# Patient Record
Sex: Female | Born: 2005 | Race: White | Hispanic: No | Marital: Single | Smoking: Never smoker
Health system: Southern US, Community
[De-identification: ages and names within clinical notes are randomized; demographics above are authoritative.]

## PROBLEM LIST (undated history)

## (undated) DIAGNOSIS — R159 Full incontinence of feces: Secondary | ICD-10-CM

## (undated) DIAGNOSIS — K5909 Other constipation: Secondary | ICD-10-CM

## (undated) HISTORY — DX: Other constipation: K59.09

## (undated) HISTORY — DX: Full incontinence of feces: R15.9

---
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
## 2023-01-23 ENCOUNTER — Telehealth: Payer: Self-pay

## 2023-01-23 NOTE — Telephone Encounter (Signed)
Copied from CRM [id number]. Topic: Appointments - Appointment Scheduling >> Jan 22, 2023 12:48 PM Tiffany H wrote: Patient's mother called to request Rachel Strickland submit an order for labwork. Patient will be coming in for a well-woman consult 01/29/23 at [facility]. Please follow up with mother once order has been submitted.

## 2023-01-24 ENCOUNTER — Telehealth: Payer: Self-pay

## 2023-01-24 DIAGNOSIS — Z131 Encounter for screening for diabetes mellitus: Secondary | ICD-10-CM

## 2023-01-24 DIAGNOSIS — Z0000 Encounter for general adult medical examination without abnormal findings: Secondary | ICD-10-CM

## 2023-01-24 DIAGNOSIS — Z13220 Encounter for screening for lipoid disorders: Secondary | ICD-10-CM

## 2023-01-24 DIAGNOSIS — Z1329 Encounter for screening for other suspected endocrine disorder: Secondary | ICD-10-CM

## 2023-01-24 NOTE — Telephone Encounter (Signed)
 Copied from CRM [id number]. Topic: Appointments - Appointment Scheduling >> Jan 22, 2023 12:48 PM Tiffany H wrote: Patient's mother called to request Rachel Strickland submit an order for labwork. Patient will be coming in for a well-woman consult 01/29/23 at [facility]. Please follow up with mother once order has been submitted.

## 2023-01-24 NOTE — Telephone Encounter (Signed)
Patient informed. 

## 2023-01-24 NOTE — Telephone Encounter (Signed)
Patient mother informed.

## 2023-01-29 ENCOUNTER — Ambulatory Visit: Payer: Self-pay | Admitting: Physician Assistant

## 2023-02-06 DIAGNOSIS — Z1329 Encounter for screening for other suspected endocrine disorder: Secondary | ICD-10-CM | POA: Diagnosis not present

## 2023-02-06 DIAGNOSIS — Z0000 Encounter for general adult medical examination without abnormal findings: Secondary | ICD-10-CM | POA: Diagnosis not present

## 2023-02-06 DIAGNOSIS — Z13220 Encounter for screening for lipoid disorders: Secondary | ICD-10-CM | POA: Diagnosis not present

## 2023-02-06 DIAGNOSIS — Z131 Encounter for screening for diabetes mellitus: Secondary | ICD-10-CM | POA: Diagnosis not present

## 2023-02-07 ENCOUNTER — Ambulatory Visit: Payer: Self-pay | Admitting: Physician Assistant

## 2023-02-07 LAB — CBC WITH DIFFERENTIAL/PLATELET
Basophils Absolute: 0.1 10*3/uL (ref 0.0–0.2)
Basos: 1 %
EOS (ABSOLUTE): 0.6 10*3/uL — ABNORMAL HIGH (ref 0.0–0.4)
Eos: 4 %
Hematocrit: 39.9 % (ref 34.0–46.6)
Hemoglobin: 13 g/dL (ref 11.1–15.9)
Immature Grans (Abs): 0 10*3/uL (ref 0.0–0.1)
Immature Granulocytes: 0 %
Lymphocytes Absolute: 4.3 10*3/uL — ABNORMAL HIGH (ref 0.7–3.1)
Lymphs: 30 %
MCH: 26.3 pg — ABNORMAL LOW (ref 26.6–33.0)
MCHC: 32.6 g/dL (ref 31.5–35.7)
MCV: 81 fL (ref 79–97)
Monocytes Absolute: 0.8 10*3/uL (ref 0.1–0.9)
Monocytes: 6 %
Neutrophils Absolute: 8.5 10*3/uL — ABNORMAL HIGH (ref 1.4–7.0)
Neutrophils: 59 %
Platelets: 472 10*3/uL — ABNORMAL HIGH (ref 150–450)
RBC: 4.95 x10E6/uL (ref 3.77–5.28)
RDW: 12.6 % (ref 11.7–15.4)
WBC: 14.3 10*3/uL — ABNORMAL HIGH (ref 3.4–10.8)

## 2023-02-07 LAB — CMP14+EGFR
ALT: 25 [IU]/L (ref 0–32)
AST: 27 [IU]/L (ref 0–40)
Albumin: 4.6 g/dL (ref 4.0–5.0)
Alkaline Phosphatase: 88 [IU]/L (ref 42–106)
BUN/Creatinine Ratio: 7 — ABNORMAL LOW (ref 9–23)
BUN: 6 mg/dL (ref 6–20)
Bilirubin Total: 0.5 mg/dL (ref 0.0–1.2)
CO2: 23 mmol/L (ref 20–29)
Calcium: 9.7 mg/dL (ref 8.7–10.2)
Chloride: 101 mmol/L (ref 96–106)
Creatinine, Ser: 0.88 mg/dL (ref 0.57–1.00)
Globulin, Total: 3.4 g/dL (ref 1.5–4.5)
Glucose: 80 mg/dL (ref 70–99)
Potassium: 4.1 mmol/L (ref 3.5–5.2)
Sodium: 138 mmol/L (ref 134–144)
Total Protein: 8 g/dL (ref 6.0–8.5)
eGFR: 97 mL/min/{1.73_m2} (ref >59)

## 2023-02-07 LAB — LIPID PANEL
Chol/HDL Ratio: 4.4 {ratio} (ref 0.0–4.4)
Cholesterol, Total: 193 mg/dL — ABNORMAL HIGH (ref 100–169)
HDL: 44 mg/dL (ref >39)
LDL Chol Calc (NIH): 118 mg/dL — ABNORMAL HIGH (ref 0–109)
Triglycerides: 177 mg/dL — ABNORMAL HIGH (ref 0–89)
VLDL Cholesterol Cal: 31 mg/dL (ref 5–40)

## 2023-02-07 LAB — TSH: TSH: 1.72 u[IU]/mL (ref 0.450–4.500)

## 2023-02-07 NOTE — Progress Notes (Signed)
WBC elevated. Looks like you could be getting sick or already sick?  Thyroid looks great.  Kidney, liver, glucose look good.  TG and LDL elevated. We can discuss this at upcoming appt.

## 2023-02-14 ENCOUNTER — Other Ambulatory Visit (HOSPITAL_COMMUNITY): Payer: Self-pay

## 2023-02-14 ENCOUNTER — Ambulatory Visit: Payer: Commercial Managed Care - PPO | Admitting: Physician Assistant

## 2023-02-14 ENCOUNTER — Encounter: Payer: Self-pay | Admitting: Physician Assistant

## 2023-02-14 VITALS — BP 121/71 | HR 86 | Ht 66.0 in | Wt 205.0 lb

## 2023-02-14 DIAGNOSIS — E6609 Other obesity due to excess calories: Secondary | ICD-10-CM | POA: Insufficient documentation

## 2023-02-14 DIAGNOSIS — E66811 Obesity, class 1: Secondary | ICD-10-CM | POA: Diagnosis not present

## 2023-02-14 DIAGNOSIS — Z23 Encounter for immunization: Secondary | ICD-10-CM | POA: Diagnosis not present

## 2023-02-14 DIAGNOSIS — N926 Irregular menstruation, unspecified: Secondary | ICD-10-CM | POA: Diagnosis not present

## 2023-02-14 DIAGNOSIS — E781 Pure hyperglyceridemia: Secondary | ICD-10-CM | POA: Diagnosis not present

## 2023-02-14 DIAGNOSIS — M79671 Pain in right foot: Secondary | ICD-10-CM

## 2023-02-14 DIAGNOSIS — Z6833 Body mass index (BMI) 33.0-33.9, adult: Secondary | ICD-10-CM

## 2023-02-14 DIAGNOSIS — E7800 Pure hypercholesterolemia, unspecified: Secondary | ICD-10-CM | POA: Diagnosis not present

## 2023-02-14 DIAGNOSIS — E663 Overweight: Secondary | ICD-10-CM | POA: Diagnosis not present

## 2023-02-14 MED ORDER — NORETHIN ACE-ETH ESTRAD-FE 1-20 MG-MCG PO TABS
1.0000 | ORAL_TABLET | Freq: Every day | ORAL | 3 refills | Status: DC
  Filled 2023-02-14: qty 84, 84d supply, fill #0
  Filled 2023-05-04: qty 84, 84d supply, fill #1
  Filled 2023-07-28: qty 84, 84d supply, fill #2
  Filled 2023-10-29: qty 84, 84d supply, fill #3

## 2023-02-14 NOTE — Progress Notes (Signed)
 Established Patient Office Visit  Subjective   Patient ID: Rachel Strickland, female    DOB: [birth year]  Age: 18 y.o. MRN: [id number]  Chief Complaint  Patient presents with   Medical Management of Chronic Issues    Menstrual cramps and irregular period    HPI Pt is a 18 yo female who presents to the clinic with mother to discuss irregular periods and weight gain. She has not been using OCP. She will go up to 100 days without a period and then get a really heavy painful period. She is in college. She does not exercise but she is active. She is worried about her lack of normal cycle.   She had labs drawn a few days ago and would like to go over.   She is having some intermittent right dorsal foot pain after jumping down off a platform 1-2 weeks ago. Pain is only intermittent and when she is weight bearing. Done nothing to make better. No swelling noted.    .. Active Ambulatory Problems    Diagnosis Date Noted   Chronic constipation    Encopresis    Closed fracture of proximal end of right humerus 05/05/2015   Situational depression 01/23/2016   Situational anxiety 01/23/2016   Stress at home 01/23/2016   Tachycardia 10/15/2017   Anxiety 10/19/2019   Celiac disease 02/08/2014   Dysmenorrhea 06/06/2021   Sprain of right ankle 06/11/2021   Elevated LDL cholesterol level 02/14/2023   Hypertriglyceridemia 02/14/2023   Class 1 obesity due to excess calories without serious comorbidity with body mass index (BMI) of 33.0 to 33.9 in adult 02/14/2023   Resolved Ambulatory Problems    Diagnosis Date Noted   No Resolved Ambulatory Problems   Past Medical History:  Diagnosis Date   Encopresis(307.7)      ROS See HPI.    Objective:     BP 121/71   Pulse 86   Ht 5' 6 (1.676 m)   Wt 205 lb (93 kg)   SpO2 99%   BMI 33.09 kg/m  BP Readings from Last 3 Encounters:  02/14/23 121/71  09/17/21 127/75 (94%, Z = 1.55 /  85%, Z = 1.04)*  06/11/21 113/71 (60%, Z = 0.25 /  72%, Z  = 0.58)*   *BP percentiles are based on the 2017 AAP Clinical Practice Guideline for girls   Wt Readings from Last 3 Encounters:  02/14/23 205 lb (93 kg) (98%, Z= 2.02)*  09/17/21 195 lb (88.5 kg) (97%, Z= 1.92)*  06/11/21 190 lb (86.2 kg) (97%, Z= 1.86)*   * Growth percentiles are based on CDC (Girls, 2-20 Years) data.      Physical Exam Constitutional:      Appearance: Normal appearance. She is obese.  HENT:     Head: Normocephalic.  Cardiovascular:     Rate and Rhythm: Normal rate.  Pulmonary:     Effort: Pulmonary effort is normal.  Musculoskeletal:     Comments: Right foot:  No swelling or bruising No pinpoint pain over bony structures of foot NROM Strength 5/5   Neurological:     General: No focal deficit present.     Mental Status: She is alert and oriented to person, place, and time.  Psychiatric:        Mood and Affect: Mood normal.         Assessment & Plan:  Rachel Strickland was seen today for medical management of chronic issues.  Diagnoses and all orders for this visit:  Irregular  periods/menstrual cycles -     norethindrone -ethinyl estradiol -FE (JUNEL  FE 1/20) 1-20 MG-MCG tablet; Take 1 tablet by mouth daily. -     PCOS Diagnostic Profile -     Insulin  and C-Peptide  Elevated LDL cholesterol level -     PCOS Diagnostic Profile -     Insulin  and C-Peptide  Hypertriglyceridemia -     PCOS Diagnostic Profile -     Insulin  and C-Peptide  Class 1 obesity due to excess calories without serious comorbidity with body mass index (BMI) of 33.0 to 33.9 in adult -     PCOS Diagnostic Profile -     Insulin  and C-Peptide  Immunization due -     Flu vaccine trivalent PF, 6mos and older(Flulaval,Afluria,Fluarix,Fluzone)   Concern for PCOS  Will order lab testing Encouraged patient to start back on OCP Discussed elevated TG and LDL Encouraged patient to work on diet and consider 16:8 intermittent fasting Goal 150 minutes of exercise a week HO given  Follow  up in 6 months  Right pain in dorsal foot No red flags Where supportive shoe for next 2 weeks Follow up as needed    Return in about 6 months (around 08/14/2023).    Anelly Samarin, PA-C

## 2023-02-14 NOTE — Patient Instructions (Signed)
Diet for Polycystic Ovary Syndrome Polycystic ovary syndrome (PCOS) is a common hormonal disorder that affects a woman's reproductive system. It can cause problems with menstrual periods and make it hard to get and stay pregnant. Changing what you eat can help your hormones reach normal levels, improve your health, and help you better manage PCOS. Following a balanced diet can help you lose weight and improve the way that your body uses the hormone insulin to control blood sugar. This may include: Eating low-fat (lean) proteins, complex carbohydrates, fresh fruits and vegetables, low-fat dairy products, healthy fats, and fiber. Cutting down on calories. Exercising regularly. What are tips for following this plan? Follow a balanced diet for meals and snacks. Eat breakfast, lunch, dinner, and one or two snacks every day. Include protein in each meal and snack. Choose whole grains instead of products that are made with refined flour. Eat a variety of foods. Exercise regularly as told by your health care provider. Aim to do at least 30 minutes of exercise on most days of the week. If you are overweight or obese: Pay attention to how many calories you eat. Cutting down on calories can help you lose weight. Work with your health care provider or a dietitian to figure out how many calories you need each day. What foods should I eat?  Fruits Include a variety of colors and types. All fruits are helpful for PCOS. Vegetables Include a variety of colors and types. All vegetables are helpful for PCOS. Grains Whole grains, such as whole wheat. Whole-grain breads, crackers, cereals, and pasta. Unsweetened oatmeal. Bulgur, barley, quinoa, and brown rice. Tortillas made from corn or whole-wheat flour. Meats and other proteins Lean proteins, such as fish, chicken, beans, eggs, and tofu. Dairy Low-fat dairy products, such as skim milk, cheese sticks, and yogurt. Beverages Low-fat or fat-free drinks, such  as water, low-fat milk, sugar-free drinks, and small amounts of 100% fruit juice. Seasonings and condiments Ketchup. Mustard. Barbecue sauce. Relish. Low-fat or fat-free mayonnaise. Fats and oils Olive oil or canola oil. Walnuts and almonds. The items listed above may not be a complete list of recommended foods and beverages. Contact a dietitian for more options. What foods should I avoid? Foods that are high in calories or fat, especially saturated or trans fats. Fried foods. Sweets. Products that are made from refined white flour, including white bread, pastries, white rice, and pasta. The items listed above may not be a complete list of foods and beverages to avoid. Contact a dietitian for more information. Summary PCOS is a hormonal imbalance that affects a woman's reproductive system. It can cause problems with menstrual periods and make it hard to get and stay pregnant. You can help to manage your PCOS by exercising regularly and eating a healthy, varied diet of vegetables, fruit, whole grains, lean protein, and low-fat dairy products. Changing what you eat can improve the way that your body uses insulin, help your hormones reach normal levels, and help you lose weight. This information is not intended to replace advice given to you by your health care provider. Make sure you discuss any questions you have with your health care provider. Document Revised: 07/08/2019 Document Reviewed: 07/08/2019 Elsevier Patient Education  2024 Elsevier Inc. Polycystic Ovary Syndrome  Polycystic ovarian syndrome (PCOS) is a common hormonal disorder among women of reproductive age. In most women with PCOS, small fluid-filled sacs (cysts) grow on the ovaries. PCOS can cause problems with menstrual periods and make it hard to get and stay  pregnant. If this condition is not treated, it can lead to serious health problems, such as diabetes and heart disease. What are the causes? The cause of this condition is  not known. It may be due to certain factors, such as: Irregular menstrual cycle. High levels of certain hormones. Problems with the hormone that helps to control blood sugar (insulin). Certain genes. What increases the risk? You are more likely to develop this condition if you: Have a family history of PCOS or type 2 diabetes. Are overweight, eat unhealthy foods, and are not active. These factors may cause problems with blood sugar control, which can contribute to PCOS or PCOS symptoms. What are the signs or symptoms? Symptoms of this condition include: Ovarian cysts and sometimes pelvic pain. Menstrual periods that are not regular or are too heavy. Inability to get or stay pregnant. Increased growth of hair on the face, chest, stomach, back, thumbs, thighs, or toes. Acne or oily skin. Acne may develop during adulthood, and it may not get better with treatment. Weight gain or obesity. Patches of thickened and dark brown or black skin on the neck, arms, breasts, or thighs. How is this diagnosed? This condition is diagnosed based on: Your medical history. A physical exam that includes a pelvic exam. Your health care provider may look for areas of increased hair growth on your skin. Tests, such as: An ultrasound to check the ovaries for cysts and to view the lining of the uterus. Blood tests to check levels of sugar (glucose), female hormone (testosterone), and female hormones (estrogen and progesterone). How is this treated? There is no cure for this condition, but treatment can help to manage symptoms and prevent more health problems from developing. Treatment varies depending on your symptoms and if you want to have a baby or if you need birth control. Treatment may include: Making nutrition and lifestyle changes. Taking the progesterone hormone to start a menstrual period. Taking birth control pills to help you have regular menstrual periods. Taking medicines such as: Medicines to make  you ovulate, if you want to get pregnant. Medicine to reduce extra hair growth. Having surgery in severe cases. This may involve making small holes in one or both of your ovaries. This decreases the amount of testosterone that your body makes. Follow these instructions at home: Take over-the-counter and prescription medicines only as told by your health care provider. Follow a healthy meal plan that includes lean proteins, complex carbohydrates, fresh fruits and vegetables, low-fat dairy products, healthy fats, and fiber. If you are overweight, lose weight as told by your health care provider. Your health care provider can determine how much weight loss is best for you and can help you lose weight safely. Keep all follow-up visits. This is important. Contact a health care provider if: Your symptoms do not get better with medicine. Your symptoms get worse or you develop new symptoms. Summary Polycystic ovarian syndrome (PCOS) is a common hormonal disorder among women of reproductive age. PCOS can cause problems with menstrual periods and make it hard to get and stay pregnant. If this condition is not treated, it can lead to serious health problems, such as diabetes and heart disease. There is no cure for this condition, but treatment can help to manage symptoms and prevent more health problems from developing. This information is not intended to replace advice given to you by your health care provider. Make sure you discuss any questions you have with your health care provider. Document Revised: 07/08/2019 Document  Reviewed: 07/08/2019 Elsevier Patient Education  2024 [employer].

## 2023-02-17 ENCOUNTER — Other Ambulatory Visit (HOSPITAL_COMMUNITY): Payer: Self-pay

## 2023-02-17 DIAGNOSIS — Z1339 Encounter for screening examination for other mental health and behavioral disorders: Secondary | ICD-10-CM | POA: Diagnosis not present

## 2023-02-17 DIAGNOSIS — N915 Oligomenorrhea, unspecified: Secondary | ICD-10-CM | POA: Diagnosis not present

## 2023-02-17 MED ORDER — NORETHINDRONE ACETATE 5 MG PO TABS
5.0000 mg | ORAL_TABLET | Freq: Every day | ORAL | 0 refills | Status: AC
  Filled 2023-02-17: qty 7, 7d supply, fill #0

## 2023-02-17 NOTE — Progress Notes (Signed)
 Rachel Strickland,   You are definitely insulin resistant with insulin level and c peptide both high in blood. Other PCOS lab panel in progress.

## 2023-02-18 ENCOUNTER — Encounter: Payer: Self-pay | Admitting: Physician Assistant

## 2023-02-18 DIAGNOSIS — M79671 Pain in right foot: Secondary | ICD-10-CM | POA: Insufficient documentation

## 2023-02-19 ENCOUNTER — Encounter: Payer: Self-pay | Admitting: Physician Assistant

## 2023-02-24 NOTE — Progress Notes (Signed)
 Panel is coming in very slowly.  Insulin level elevated indicating insulin resistance Thyroid looks good.

## 2023-03-03 ENCOUNTER — Encounter: Payer: Self-pay | Admitting: Physician Assistant

## 2023-03-03 LAB — PCOS DIAGNOSTIC PROFILE
17-Alpha-Hydroxyprogesterone: 34 ng/dL
ANTI-MULLERIAN HORMONE (AMH): 5.5 ng/mL
DHEA-Sulfate, LCMS: 73 ug/dL
Estradiol, Serum, MS: 35 pg/mL
Follicle Stimulating Hormone: 7.6 m[IU]/mL
Free Testosterone, Serum: 5.6 pg/mL
Luteinizing Hormone (LH) ECL: 17 m[IU]/mL
Prolactin: 22.2 ng/mL
Sex Hormone Binding Globulin: 29.1 nmol/L
TSH: 2 uU/mL
Testosterone, Serum (Total): 37 ng/dL
Testosterone-% Free: 1.5 %

## 2023-03-03 LAB — INSULIN AND C-PEPTIDE, SERUM
C-Peptide: 7.7 ng/mL — ABNORMAL HIGH (ref 1.1–4.4)
INSULIN: 81.5 u[IU]/mL — ABNORMAL HIGH (ref 2.6–24.9)

## 2023-03-03 NOTE — Progress Notes (Signed)
New results came in.   Thyroid normal.  Testosterone normal (which is good). All hormones look good.   Other than diet changes. Our biggest goal will be to treat insulin resistance. I do suggest starting metformin. Thoughts?

## 2023-03-04 ENCOUNTER — Encounter: Payer: Self-pay | Admitting: Physician Assistant

## 2023-05-05 ENCOUNTER — Other Ambulatory Visit (HOSPITAL_COMMUNITY): Payer: Self-pay

## 2023-05-05 ENCOUNTER — Other Ambulatory Visit: Payer: Self-pay

## 2023-05-06 ENCOUNTER — Other Ambulatory Visit: Payer: Self-pay

## 2023-07-28 ENCOUNTER — Other Ambulatory Visit (HOSPITAL_COMMUNITY): Payer: Self-pay

## 2023-09-11 ENCOUNTER — Ambulatory Visit: Payer: Self-pay | Admitting: Dermatology

## 2023-09-11 ENCOUNTER — Other Ambulatory Visit: Payer: Self-pay

## 2023-09-11 ENCOUNTER — Other Ambulatory Visit (HOSPITAL_BASED_OUTPATIENT_CLINIC_OR_DEPARTMENT_OTHER): Payer: Self-pay

## 2023-09-11 ENCOUNTER — Other Ambulatory Visit (HOSPITAL_COMMUNITY): Payer: Self-pay

## 2023-09-11 DIAGNOSIS — L2089 Other atopic dermatitis: Secondary | ICD-10-CM

## 2023-09-11 DIAGNOSIS — D2262 Melanocytic nevi of left upper limb, including shoulder: Secondary | ICD-10-CM

## 2023-09-11 DIAGNOSIS — L209 Atopic dermatitis, unspecified: Secondary | ICD-10-CM | POA: Diagnosis not present

## 2023-09-11 DIAGNOSIS — D229 Melanocytic nevi, unspecified: Secondary | ICD-10-CM

## 2023-09-11 MED ORDER — TRIAMCINOLONE ACETONIDE 0.1 % EX CREA
1.0000 | TOPICAL_CREAM | Freq: Two times a day (BID) | CUTANEOUS | 3 refills | Status: AC | PRN
  Filled 2023-09-11: qty 240, 30d supply, fill #0

## 2023-09-11 NOTE — Progress Notes (Signed)
   New Patient Visit   Subjective  Rachel Strickland is a 18 y.o. female who presents for the following: Spot  Rachel Strickland is here for a spot check. She has one spot of concern on L Shoulder and a few moles on her right arm.      She also has dry spots on L arm and R breast she would like to get looked at. When dry spots are flared, she will use Cereve Petroleum jelly which helps for the hydration but not itching. On a scale from 1-10 is about an 8. Rachel Strickland uses Optometrist wash and bath and body works lotion   The patient has spots, moles and lesions to be evaluated, some may be new or changing and the patient may have concern these could be cancer.    The following portions of the chart were reviewed this encounter and updated as appropriate: medications, allergies, medical history  Review of Systems:  No other skin or systemic complaints except as noted in HPI or Assessment and Plan.  Objective  Well appearing patient in no apparent distress; mood and affect are within normal limits.  A focused examination was performed of the following areas: Bilateral arms, L Shoulder Relevant physical exam findings are noted in the Assessment and Plan.    Assessment & Plan   ATOPIC DERMATITIS Exam: Scaly pink papules coalescing to plaques  Flared  Atopic dermatitis (eczema) is a chronic, relapsing, pruritic condition that can significantly affect quality of life. It is often associated with allergic rhinitis and/or asthma and can require treatment with topical medications, phototherapy, or in severe cases biologic injectable medication (Dupixent; Adbry) or Oral JAK inhibitors.  Treatment Plan: -Triamcinolone  to use twice a day for up to two weeks. Take a break for two weeks. Can restart if not clear -Continue Cereve ointment or vasaline  -Recommends gentle body care  Recommend gentle skin care.  MELANOCYTIC NEVI Exam: Tan-brown and/or pink-flesh-colored symmetric macules and papules  L  Posterior Shoulder 5mm Brown papule   Treatment Plan: Benign appearing on exam today. Recommend observation. Call clinic for new or changing moles. Recommend daily use of broad spectrum spf 30+ sunscreen to sun-exposed areas.    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 Recommend observation. Call clinic for new or changing moles. Recommend daily use of broad spectrum spf 30+ sunscreen to sun-exposed areas.       No follow-ups on file.  I, Rachel Strickland, CMA, am acting as scribe for [employer], DO.   Documentation: I have reviewed the above documentation for accuracy and completeness, and I agree with the above.  Rachel Lenis, DO

## 2023-09-11 NOTE — Patient Instructions (Addendum)
 Date: Thu Sep 11 2023  Hello Maurilio,  Thank you for visiting today. Here is a summary of the key instructions:  - Medications:   - Apply triamcinolone  ointment to affected areas every morning and night for up to 2 weeks   - After 2 weeks of use, stop triamcinolone  for 2 weeks   - During 2-week breaks, apply CeraVe ointment or similar moisturizer   - Repeat this 2-week on, 2-week off cycle until eczema clears  - Skin Care:   - Apply sunscreen daily, especially on moles   - Consider switching to [employer] or [employer] Skin body wash   - Apply moisturizer daily after showering   - Try samples of [employer], CeraVe, or Eucerin Advanced Repair moisturizers   - Apply Aquaphor, CeraVe ointment, or Vaseline daily to areas prone to eczema flares  - Lifestyle Changes:   - Avoid applying scented products directly to skin   - Use fragrance sprays on clothes instead of skin  - Follow-up:   - Schedule a follow-up appointment in January  - Monitoring:   - Check moles once a year for changes   - Watch for moles that double in size, become chronically itchy, or change color drastically  We look forward to seeing you at your next visit. If you have any questions or concerns before then, please do not hesitate to contact our office.  Warm regards,  Dr. [facility]    Important Information  Due to recent changes in healthcare laws, you may see results of your pathology and/or laboratory studies on MyChart before the doctors have had a chance to review them. We understand that in some cases there may be results that are confusing or concerning to you. Please understand that not all results are received at the same time and often the doctors may need to interpret multiple results in order to provide you with the best plan of care or course of treatment. Therefore, we ask that you please give us  2 business days to thoroughly review all your results before contacting the  office for clarification. Should we see a critical lab result, you will be contacted sooner.   If You Need Anything After Your Visit  If you have any questions or concerns for your doctor, please call our main line at [phone] If no one answers, please leave a voicemail as directed and we will return your call as soon as possible. Messages left after 4 pm will be answered the following business day.   You may also send us  a message via MyChart. We typically respond to MyChart messages within 1-2 business days.  For prescription refills, please ask your pharmacy to contact our office. Our fax number is [phone].  If you have an urgent issue when the clinic is closed that cannot wait until the next business day, you can page your doctor at the number below.    Please note that while we do our best to be available for urgent issues outside of office hours, we are not available 24/7.   If you have an urgent issue and are unable to reach us , you may choose to seek medical care at your doctor's office, retail clinic, urgent care center, or emergency room.  If you have a medical emergency, please immediately call 911 or go to the emergency department. In the event of inclement weather, please call our main line at [phone] for an update on the status of any delays or closures.  Dermatology Medication Tips: Please keep the boxes that topical medications come in in order to help keep track of the instructions about where and how to use these. Pharmacies typically print the medication instructions only on the boxes and not directly on the medication tubes.   If your medication is too expensive, please contact our office at [phone] or send us  a message through MyChart.   We are unable to tell what your co-pay for medications will be in advance as this is different depending on your insurance coverage. However, we may be able to find a substitute medication at lower cost or fill out  paperwork to get insurance to cover a needed medication.   If a prior authorization is required to get your medication covered by your insurance company, please allow us  1-2 business days to complete this process.  Drug prices often vary depending on where the prescription is filled and some pharmacies may offer cheaper prices.  The website www.goodrx.com contains coupons for medications through different pharmacies. The prices here do not account for what the cost may be with help from insurance (it may be cheaper with your insurance), but the website can give you the price if you did not use any insurance.  - You can print the associated coupon and take it with your prescription to the pharmacy.  - You may also stop by our office during regular business hours and pick up a GoodRx coupon card.  - If you need your prescription sent electronically to a different pharmacy, notify our office through [facility] or by phone at [phone]

## 2023-09-12 ENCOUNTER — Other Ambulatory Visit (HOSPITAL_COMMUNITY): Payer: Self-pay

## 2023-09-12 ENCOUNTER — Other Ambulatory Visit: Payer: Self-pay

## 2023-09-16 ENCOUNTER — Encounter: Payer: Self-pay | Admitting: Dermatology

## 2023-10-30 ENCOUNTER — Other Ambulatory Visit (HOSPITAL_COMMUNITY): Payer: Self-pay

## 2024-02-16 ENCOUNTER — Other Ambulatory Visit: Payer: Self-pay | Admitting: Physician Assistant

## 2024-02-16 DIAGNOSIS — N926 Irregular menstruation, unspecified: Secondary | ICD-10-CM

## 2024-02-19 ENCOUNTER — Other Ambulatory Visit (HOSPITAL_COMMUNITY): Payer: Self-pay

## 2024-02-19 ENCOUNTER — Other Ambulatory Visit: Payer: Self-pay

## 2024-02-19 MED ORDER — TARINA FE 1/20 EQ 1-20 MG-MCG PO TABS
1.0000 | ORAL_TABLET | Freq: Every day | ORAL | 3 refills | Status: AC
  Filled 2024-02-19: qty 84, 84d supply, fill #0

## 2024-02-27 ENCOUNTER — Other Ambulatory Visit (HOSPITAL_COMMUNITY): Payer: Self-pay

## 2024-03-03 ENCOUNTER — Ambulatory Visit: Admitting: Dermatology
